# Patient Record
Sex: Male | Born: 1990 | Race: White | Hispanic: No | Marital: Single | Smoking: Former smoker
Health system: Southern US, Academic
[De-identification: ages and names within clinical notes are randomized; demographics above are authoritative.]

## PROBLEM LIST (undated history)

## (undated) DIAGNOSIS — D179 Benign lipomatous neoplasm, unspecified: Secondary | ICD-10-CM

## (undated) HISTORY — DX: Benign lipomatous neoplasm, unspecified: D17.9

## (undated) HISTORY — PX: SHOULDER SURGERY: SHX246

## (undated) HISTORY — PX: HX HERNIA REPAIR: SHX51

---
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
## 2019-04-08 ENCOUNTER — Ambulatory Visit (INDEPENDENT_AMBULATORY_CARE_PROVIDER_SITE_OTHER): Payer: 59 | Admitting: Surgery

## 2019-04-08 ENCOUNTER — Encounter (INDEPENDENT_AMBULATORY_CARE_PROVIDER_SITE_OTHER): Payer: Self-pay | Admitting: Surgery

## 2019-04-08 ENCOUNTER — Other Ambulatory Visit: Payer: Self-pay

## 2019-04-08 VITALS — BP 137/83 | HR 83 | Temp 96.9°F | Ht 66.0 in | Wt 193.0 lb

## 2019-04-08 DIAGNOSIS — M7989 Other specified soft tissue disorders: Secondary | ICD-10-CM

## 2019-04-08 NOTE — Progress Notes (Signed)
Name: Ronald Hart  Date of Birth: Oct 02, 1990  Date: 04/08/2019    Chief Complaint:  I have a mass on my right forearm    HPI: Ronald Hart is a pleasant 28 y.o. male who was referred to my office for evaluation and treatment of right forearm lipoma. he has been complaining of an enlarging soft tissue mass/possible lipoma on the right forearm that has been present for several years now.  The patient noted he 1st felt the mass about 4 years ago.  Since that time it has been increasing in size.  It can cause pain/discomfort with pushing on it.  No skin drainage or erythema        Past Medical History:   Diagnosis Date   . Lipoma            Past Surgical History:   Procedure Laterality Date   . HX HERNIA REPAIR     . SHOULDER SURGERY             Outpatient Encounter Medications as of 04/08/2019   Medication Sig Dispense Refill   . imiquimod (ALDARA) 5 % Cream in Packet apply topically to affected areas at bedtime 3 times a week for 16 weeks     . loratadine (CLARITIN) 10 mg Oral Tablet Allergy Relief (loratadine) 10 mg tablet     . montelukast (SINGULAIR) 10 mg Oral Tablet montelukast 10 mg tablet       No facility-administered encounter medications on file as of 04/08/2019.        No Known Allergies    Family Medical History:     Problem Relation (Age of Onset)    Cancer Paternal Aunt    Hypertension (High Blood Pressure) Mother              Social History     Tobacco Use   . Smoking status: Former Research scientist (life sciences)   . Smokeless tobacco: Never Used   Substance Use Topics   . Alcohol use: Never     Frequency: Never   . Drug use: Never       Review of Systems      Review of Systems 04/08/2019   Constitutional: negative   Eyes: negative   Ears, nose, mouth, and throat: negative   Respiratory: negative   Cardiovascular: negative   Gastrointestinal: negative   Genitourinary: negative   Integument/breast: negative   Hematologic/lymphatic: negative   Musculoskeletal: positive for       + joint aches;stiff joints   Neurological:  negative   Behavioral/Psych: negative   Endocrine: negative   Allergic/Immunologic: negative             PHYSICAL EXAM:    BP 137/83   Pulse 83   Temp 36.1 C (96.9 F)   Ht 1.676 m (5\' 6" )   Wt 87.5 kg (193 lb)   BMI 31.15 kg/m       -Awake, alert and Oriented x3. No acute distress  -Head is atraumatic, normocephalic.  -Neck is supple, has no palpable lymph nodes, no carotid bruit  -Chest with no deformities, clear to auscultation bilaterally.  -Heart has regular rate and rhythm, no rubs, murmurs or gallops.  -Abdomen is soft, non distended non tender to palpation. There are no hernias felt and no visceromegaly.  -Groins are soft with no nodules and no hernias identified.  -Extremities with no clubbing, no edema and no cyanosis.  Right forearm medial soft tissue mass, mobile, well-circumscribed, approximately 2 x 2 cm  -neurologically  the patient is A&O X 3, has MOE x 4,  And is able to ambulate without assist.        Assessment/PLAN:    ICD-10-CM    1. Soft tissue mass  M79.89      -patient appears to have a soft tissue mass that is about 2 x 2 cm on the medial aspect of the right forearm.  This has been increasing in size and causes pain with direct pressure  -plan for excision of soft tissue mass of right forearm under sedation.  Risks and benefits discussed with the patient and he gave informed consent

## 2019-04-08 NOTE — Nursing Note (Signed)
Patient is here for lipoma on arm

## 2019-04-17 DIAGNOSIS — D179 Benign lipomatous neoplasm, unspecified: Secondary | ICD-10-CM

## 2019-04-24 ENCOUNTER — Other Ambulatory Visit (INDEPENDENT_AMBULATORY_CARE_PROVIDER_SITE_OTHER): Payer: Self-pay

## 2020-10-29 ENCOUNTER — Encounter (HOSPITAL_COMMUNITY): Payer: Self-pay

## 2020-10-29 ENCOUNTER — Other Ambulatory Visit: Payer: Self-pay

## 2020-10-29 ENCOUNTER — Emergency Department (HOSPITAL_COMMUNITY): Payer: 59

## 2020-10-29 ENCOUNTER — Emergency Department
Admission: EM | Admit: 2020-10-29 | Discharge: 2020-10-29 | Disposition: A | Discharge status: Home / Self Care | Payer: 59 | Attending: Nurse Practitioner | Admitting: Nurse Practitioner

## 2020-10-29 DIAGNOSIS — Z87891 Personal history of nicotine dependence: Secondary | ICD-10-CM | POA: Insufficient documentation

## 2020-10-29 DIAGNOSIS — R059 Cough, unspecified: Secondary | ICD-10-CM

## 2020-10-29 DIAGNOSIS — R0781 Pleurodynia: Secondary | ICD-10-CM | POA: Insufficient documentation

## 2020-10-29 DIAGNOSIS — J45990 Exercise induced bronchospasm: Secondary | ICD-10-CM | POA: Insufficient documentation

## 2020-10-29 MED ORDER — NAPROXEN SODIUM 550 MG TABLET
550.00 mg | ORAL_TABLET | Freq: Two times a day (BID) | ORAL | 0 refills | Status: AC
Start: 2020-10-29 — End: 2020-11-08

## 2020-10-29 NOTE — ED Provider Notes (Signed)
[facility]           Name: Ronald Hart  Age and Gender: 30 y.o. male  PCP: No Pcp    Chief Complaint:  Patient presents with     Chief Complaint   Patient presents with   . Back Pain   . Rib Pain       HPI    HPI     Ronald Hart, date of birth 1991-04-21, is a 30 y.o. male who presents to the Emergency Department with a chief complaint of nonproductive cough, left rib pain for one week.  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 Denies fever.  Took Ibuprofen this morning for pain        ROS:   Review of Systems   Constitutional: Negative for chills and fever.   Respiratory: Positive for cough. Negative for shortness of breath.    Gastrointestinal: Negative.    Musculoskeletal: Negative for back pain.   Skin: Negative.    Neurological: Negative.          Past Medical History:  Diagnosis     Past Medical History:   Diagnosis Date   . Lipoma        Past Surgical History:  Past Surgical History:   Procedure Laterality Date   . Hx hernia repair     . Shoulder surgery         Family History:   Family History   Problem Relation Age of Onset   . Hypertension (High Blood Pressure) Mother    . Cancer Paternal Aunt        Social History     Social History     Tobacco Use   . Smoking status: Former Research scientist (life sciences)   . Smokeless tobacco: Never Used   Substance Use Topics   . Alcohol use: Never   . Drug use: Never       Social History     Substance and Sexual Activity   Drug Use Never       No Pcp    No Known Allergies      PE:   ED Triage Vitals [10/29/20 0735]   BP (Non-Invasive) (!) 160/101   Heart Rate 83   Respiratory Rate 18   Temperature 36.1 C (97 F)   SpO2 100 %   Weight 107 kg (235 lb)   Height 1.676 m (5\' 6" )     Physical Exam  HENT:      Mouth/Throat:      Mouth: Mucous membranes are moist.   Cardiovascular:      Rate and Rhythm: Normal rate.   Pulmonary:      Effort: Pulmonary effort is normal.      Breath sounds: Normal breath sounds. No wheezing.   Musculoskeletal:         General:  Tenderness present. Normal range of motion.      Comments: Mild tenderness to left anterior/lateral lower chest wall   Skin:     General: Skin is warm and dry.   Neurological:      Mental Status: He is alert and oriented to person, place, and time.   Psychiatric:         Mood and Affect: Mood normal.         DDx:  Differential diagnosis includes, but is not limited to bronchitis, pneumonia, costochondritis, fracture rib    Initial workup:      Orders:  Orders Placed This Encounter   . XR  CHEST PA AND LATERAL   . naproxen sodium (ANAPROX) 550 mg Oral Tablet           Diagnostics:    Labs:  No results found for this or any previous visit (from the past 12 hour(s)).  Labs reviewed and interpreted by me.    Radiology:    XR CHEST PA AND LATERAL   Final Result by Edi, Radresults In (06/18 0840)   Unremarkable two-view chest.  Clear lungs and unremarkable heart size.         Signed by Theresa Mulligan, MD        Negative per Dr. Olevia Bowens  EKG:  No results found for this visit on 10/29/20 (from the past 720 hour(s)).      ED Course/MDM:     During the patient's stay in the emergency department, the above listed imaging and/or labs were performed to assist with medical decision making and were reviewed by myself when available for review.    Patient rechecked and remained stable throughout remainder of emergency department course.    All questions/concerns addressed, and patient agrees with disposition plan.   Patient presents for one week history of nonproductive cough and left rib pain.  Denies fever. Denies back pain.  Has not called PCP.  Taking Ibuprofen for pain without relief.  Xray is negative per Dr. Olevia Bowens.  Patient will be discharged to home.            Medications given during ED stay include:       Clinical Impression:   Encounter Diagnoses   Name Primary?   . Rib pain on left side Yes   . Cough        Disposition: Discharged        // Terrance Mass, CRNP 10/29/2020, 07:43   [place], [facility]  Emergency Department

## 2020-10-29 NOTE — ED Triage Notes (Signed)
Pt states he was coughing last night and developed pain in ribs and back

## 2021-12-13 ENCOUNTER — Inpatient Hospital Stay (HOSPITAL_BASED_OUTPATIENT_CLINIC_OR_DEPARTMENT_OTHER)
Admission: RE | Admit: 2021-12-13 | Discharge: 2021-12-13 | Disposition: A | Discharge status: Home / Self Care | Payer: 59 | Source: Ambulatory Visit | Admitting: Radiology

## 2021-12-13 ENCOUNTER — Ambulatory Visit: Payer: 59 | Attending: ORTHOPAEDIC SURGERY | Admitting: ORTHOPAEDIC SURGERY

## 2021-12-13 ENCOUNTER — Encounter (HOSPITAL_COMMUNITY): Payer: Self-pay | Admitting: ORTHOPAEDIC SURGERY

## 2021-12-13 ENCOUNTER — Other Ambulatory Visit: Payer: Self-pay

## 2021-12-13 VITALS — Ht 67.0 in | Wt 230.0 lb

## 2021-12-13 DIAGNOSIS — Z9889 Other specified postprocedural states: Secondary | ICD-10-CM

## 2021-12-13 DIAGNOSIS — M7581 Other shoulder lesions, right shoulder: Secondary | ICD-10-CM

## 2021-12-13 DIAGNOSIS — M75101 Unspecified rotator cuff tear or rupture of right shoulder, not specified as traumatic: Secondary | ICD-10-CM | POA: Insufficient documentation

## 2021-12-13 DIAGNOSIS — M25511 Pain in right shoulder: Secondary | ICD-10-CM

## 2021-12-13 DIAGNOSIS — Z4789 Encounter for other orthopedic aftercare: Secondary | ICD-10-CM

## 2021-12-13 NOTE — Progress Notes (Signed)
[facility] Note       Name: Ronald Hart  MRN: [id number]    Patient ID: Ronald Hart is an 31 y.o. male.  Chief Complaint:     Chief Complaint   Patient presents with    Shoulder Pain     Rt , has had labrum repair , injections have helped in the past.        HPI/Subjective:    Patient is a very pleasant 31 year old who presents for re-evaluation.  Many years ago I performed a labral repair for him.  That has done well he is had no residual instability.  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  He is had increasing shoulder pain.  He wanted to make sure it was not do anything wrong to his shoulder.      He describes the pain as sharp in character.  The pain can be rated at a 6/10.  It is typically improved by rest, ice, activity modifications.  There is a mild amount of post activity pain.  Pain is improved by over-the-counter medications.  Both NSAIDs and Tylenol seem to help.  Pain is made worse by significant or forceful exercise.  Also long periods of use do exacerbate the pain.      Current Outpatient Medications   Medication Sig    busPIRone (BUSPAR) 10 mg Oral Tablet Take 1 Tablet (10 mg total) by mouth Twice daily    escitalopram oxalate (LEXAPRO) 20 mg Oral Tablet TAKE ONE AND A HALF TABLETS BY MOUTH DAILY FOR A TOTAL DAILY DOSE OF 30 MG    Ibuprofen (MOTRIN) 200 mg Oral Tablet Take 1 Tablet (200 mg total) by mouth Four times a day as needed for Pain    imiquimod (ALDARA) 5 % Cream in Packet apply topically to affected areas at bedtime 3 times a week for 16 weeks (Patient not taking: Reported on 12/13/2021)    loratadine (CLARITIN) 10 mg Oral Tablet Allergy Relief (loratadine) 10 mg tablet (Patient not taking: Reported on 12/13/2021)    mirtazapine (REMERON) 15 mg Oral Tablet Take 1 Tablet (15 mg total) by mouth Every night    montelukast (SINGULAIR) 10 mg Oral Tablet montelukast 10 mg tablet  (Patient not taking: Reported on 12/13/2021)     No Known Allergies   Social History     Substance and Sexual Activity   Alcohol Use Never     Social History     Tobacco Use   Smoking Status Former   Smokeless Tobacco Never     Social History     Substance and Sexual Activity   Drug Use Never       Review of Systems:  Constitutional: Negative for activity change, appetite change, diaphoresis and fatigue.   HENT: Negative for hearing loss, nosebleeds, postnasal drip, rhinorrhea, sinus pain and sore throat.    Eyes: Negative for photophobia, discharge and redness.   Respiratory: Negative for cough, choking, chest tightness, shortness of breath and wheezing.    Cardiovascular: Negative for chest pain, palpitations and leg swelling.   Gastrointestinal: Negative for abdominal distention, constipation, diarrhea, nausea and vomiting.   Genitourinary: Negative for difficulty urinating, dysuria, hematuria and urgency.   Neurological: Negative for dizziness, seizures, light-headedness and numbness.   Psychiatric/Behavioral: Negative for agitation, confusion, dysphoric mood and sleep disturbance.     Physical Exam  Vitals:  12/13/21 1523   Weight: 104 kg (230 lb)   Height: 1.702 m ('5\' 7"' )   BMI: 36.1      Constitutional:       General: He is not in acute distress.     Appearance: Normal appearance. He is not ill-appearing or diaphoretic.   Eyes:      Extraocular Movements: Extraocular movements intact.      Pupils: Pupils are equal, round, and reactive to light.   Skin:     Coloration: Skin is not jaundiced.      Findings: No Rash. No lesion.   Neurological:      Mental Status: He is alert.   Musculoskeletal:      Right Elbow Exam      Right elbow exam is normal.  Left Elbow Exam      Left elbow exam is normal.  Right Shoulder Exam   Tenderness      The patient is experiencing tenderness in the acromioclavicular joint.  Range of Motion      Forward Flexion: 165     Active abduction:  180     Passive abduction:  180      Extension: 40      External rotation: 30      Internal rotation 0 degrees: Lumbar   Muscle Strength      External rotation: 4/5      Supraspinatus: 4/5   Tests      Apprehension: neg     Hawkins test: positive     Cross arm: neg     Impingement: positive     Speed:  Negative, Yergsun's: positive  Other      Sensation: normal     Pulse: present  Right    Imaging:  X-rays reviewed.    Assessment:  Assessment/Plan   1. Right shoulder pain    2. HX rotator cuff repair right    3. Tear of right rotator cuff, unspecified tear extent, unspecified whether traumatic recurrent        Will proceed with an MR.  I would like to do an MR arthrogram.  I do not think this is a labral problem but he does seem to be having trouble with the shoulder with both impingement signs and biceps signs.  Crossed arm adduction is nontender and so I think the [facility] joint is okay.  He has been symptomatic and has had previous surgery I would like to review the labral repairs well.  We will get the arthrogram and then see him back to make further recommendations.  He is had trouble now for several months and so I do not think that he needs therapy given that he is already had surgery for this condition.    Plan:  Orders Placed This Encounter    XR SHOULDER RIGHT    CANCELED: MRI Right Shoulder without contrast    MRI ARTHROGRAM - SHOULDER RIGHT    BUN    CREATININE WITH EGFR    FLUORO ARTHROGRAM SHOULDER RIGHT         There are no exam notes on file for this visit.      Tarry Kos, MD,   12/13/2021      [address], [place]  [place] [id number]

## 2021-12-14 DIAGNOSIS — M25511 Pain in right shoulder: Secondary | ICD-10-CM

## 2022-01-11 ENCOUNTER — Inpatient Hospital Stay
Admission: RE | Admit: 2022-01-11 | Discharge: 2022-01-11 | Disposition: A | Discharge status: Home / Self Care | Payer: 59 | Source: Ambulatory Visit | Attending: ORTHOPAEDIC SURGERY | Admitting: ORTHOPAEDIC SURGERY

## 2022-01-11 ENCOUNTER — Ambulatory Visit (HOSPITAL_COMMUNITY)
Admission: RE | Admit: 2022-01-11 | Discharge: 2022-01-11 | Disposition: A | Discharge status: Home / Self Care | Payer: 59 | Source: Ambulatory Visit | Attending: ORTHOPAEDIC SURGERY | Admitting: ORTHOPAEDIC SURGERY

## 2022-01-11 ENCOUNTER — Other Ambulatory Visit: Payer: Self-pay

## 2022-01-11 DIAGNOSIS — M75101 Unspecified rotator cuff tear or rupture of right shoulder, not specified as traumatic: Secondary | ICD-10-CM

## 2022-01-11 DIAGNOSIS — Z9889 Other specified postprocedural states: Secondary | ICD-10-CM | POA: Insufficient documentation

## 2022-01-11 DIAGNOSIS — S43431A Superior glenoid labrum lesion of right shoulder, initial encounter: Secondary | ICD-10-CM | POA: Insufficient documentation

## 2022-01-11 DIAGNOSIS — M25511 Pain in right shoulder: Secondary | ICD-10-CM | POA: Insufficient documentation

## 2022-01-11 MED ORDER — GADOBENATE DIMEGLUMINE 529 MG/ML(0.1 MMOL/0.2 ML) INTRAVENOUS SOLUTION
0.3000 mL | INTRAVENOUS | Status: AC
Start: 2022-01-11 — End: 2022-01-11
  Administered 2022-01-11: 0.3 mL via INTRAVENOUS

## 2022-01-11 MED ORDER — IOHEXOL 300 MG IODINE/ML INTRAVENOUS SOLUTION
5.0000 mL | INTRAVENOUS | Status: AC
Start: 2022-01-11 — End: 2022-01-11
  Administered 2022-01-11: 5 mL via INTRA_ARTICULAR

## 2022-01-11 MED ORDER — ROPIVACAINE (PF) 5 MG/ML (0.5 %) INJECTION SOLUTION
INTRAMUSCULAR | Status: AC
Start: 2022-01-11 — End: 2022-01-11
  Filled 2022-01-11: qty 30

## 2022-01-11 MED ORDER — ROPIVACAINE (PF) 5 MG/ML (0.5 %) INJECTION SOLUTION
5.0000 mL | Freq: Once | INTRAMUSCULAR | Status: DC
Start: 2022-01-11 — End: 2022-01-12

## 2023-04-04 IMAGING — MR ARTHROGRAM MRI LT SHOULDER
6 series · 40 of 40 positions shown · IV contrast (multihance)
Comparison: none

﻿

Pertinent Hx:    Previous surgery.  Instability and pain.  Date of injury 03/02/2022.
TECHNIQUE: Using sterile technique and local anesthesia and with fluoroscopic guidance, a 20-gauge needle was placed into the shoulder.  Approximately 15 cc of a dilute mixture of MultiHance mixed in normal saline were injected into the shoulder along with a small amount of [id number].  Fluoro time: 0.4 min. 
T1 and T2-weighted coronal oblique, T1-weighted sagittal oblique, and T1 and T2-weighted axial images of the shoulder were taken.  Proton density sagittal images were taken with the shoulder abducted and externally rotated.

[Series 3: T1 · axial · 4.0mm · 0.52mm/px · z∈[-63,+38]mm · 8 of 22 slices shown]
[im 1/22]
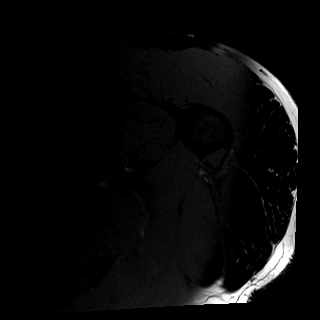
[im 4/22]
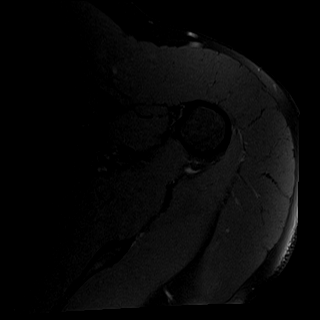
[im 7/22]
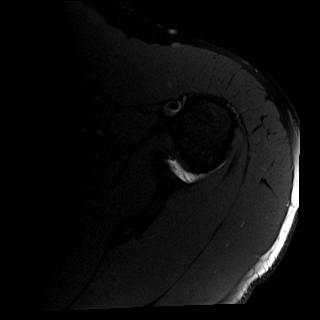
[im 10/22]
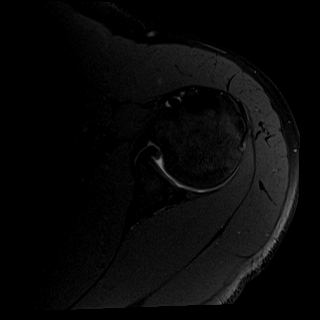
[im 13/22]
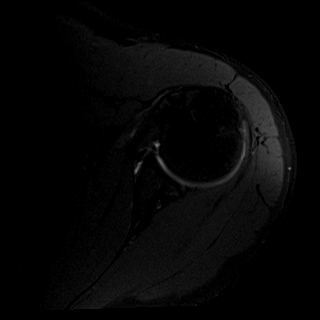
[im 16/22]
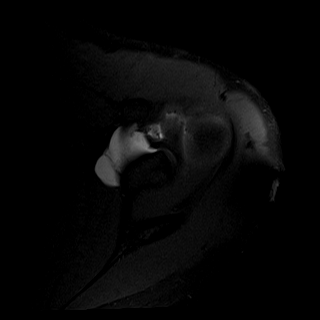
[im 19/22]
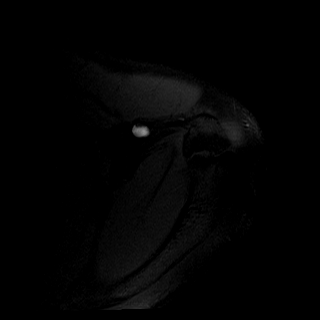
[im 22/22]
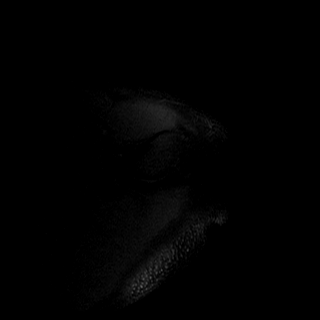

[Series 4: T1 fat-sat · oblique · 4.0mm · 0.50mm/px · 6 of 19 slices shown (1 of 2)]
[im 1/19]
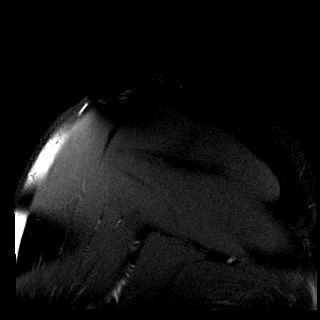
[im 4/19]
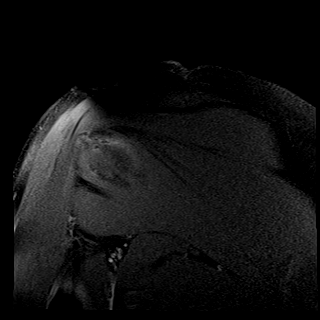
[im 8/19]
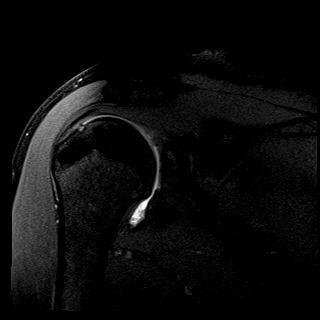
[im 11/19]
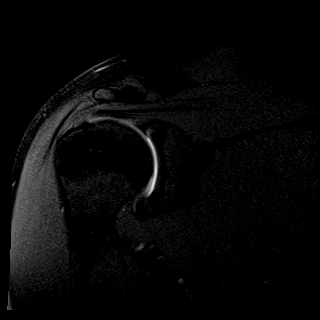
[im 15/19]
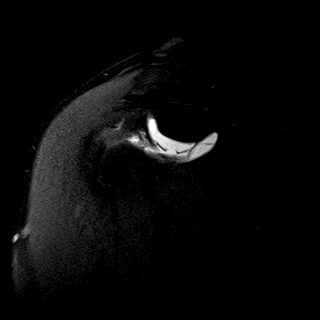
[im 19/19]
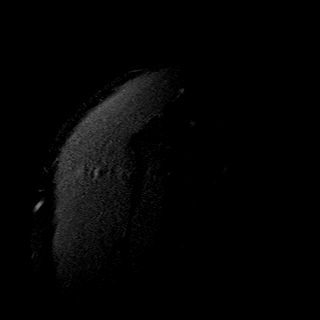

[Series 5: T2 fat-sat · oblique · 4.0mm · 0.50mm/px · 6 of 19 slices shown]
[im 1/19]
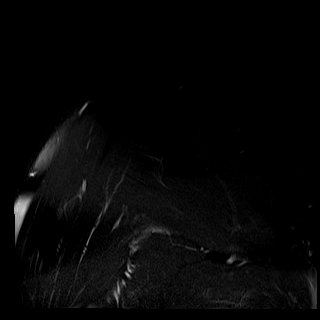
[im 4/19]
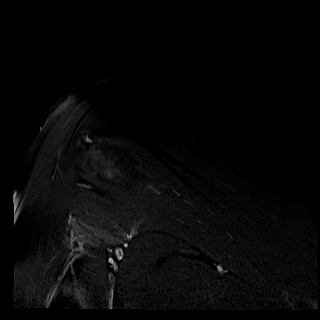
[im 8/19]
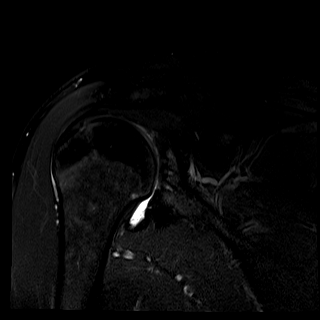
[im 11/19]
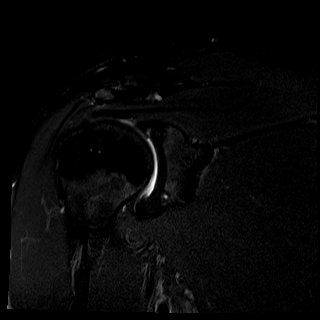
[im 15/19]
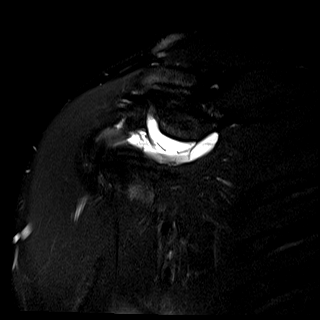
[im 19/19]
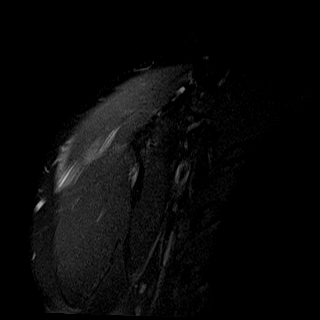

[Series 6: T1 fat-sat · oblique · 4.0mm · 0.50mm/px · 7 of 22 slices shown (2 of 2)]
[im 1/22]
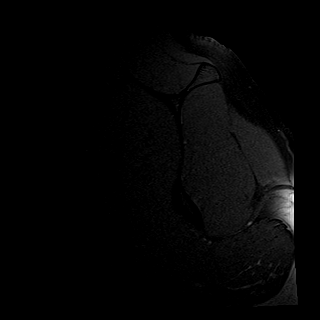
[im 4/22]
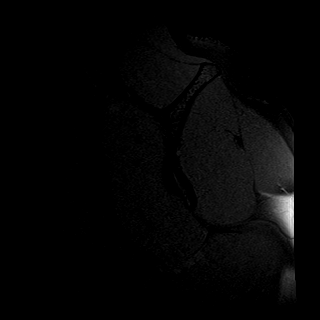
[im 8/22]
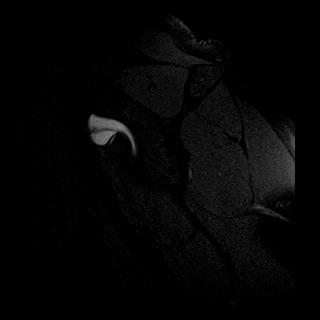
[im 11/22]
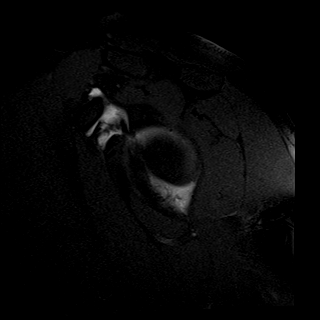
[im 15/22]
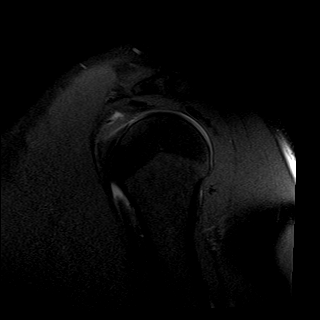
[im 18/22]
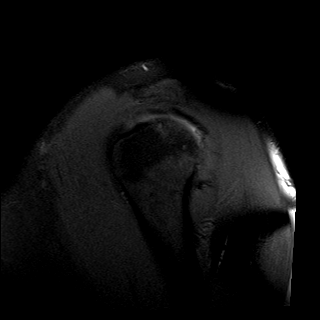
[im 22/22]
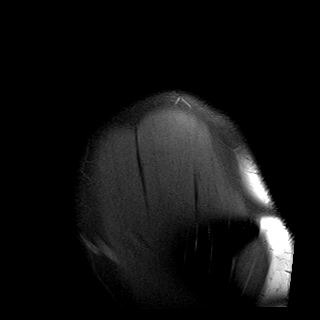

[Series 7: T2 · axial · 4.0mm · 0.50mm/px · z∈[-62,+39]mm · 7 of 22 slices shown]
[im 1/22]
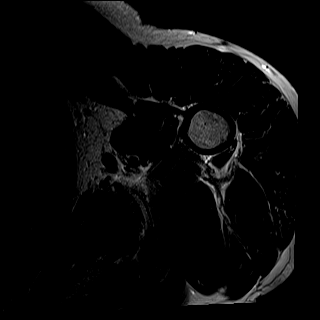
[im 4/22]
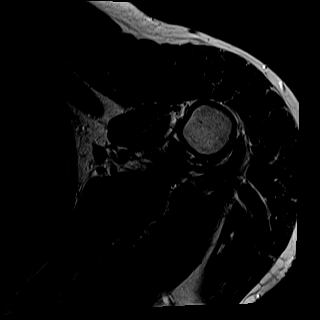
[im 8/22]
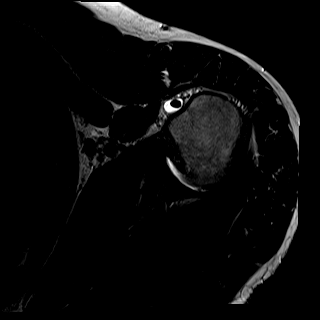
[im 11/22]
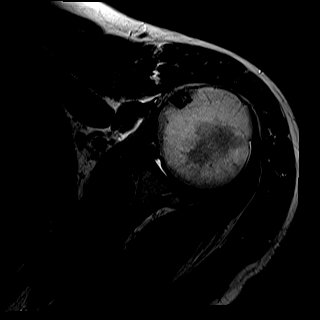
[im 15/22]
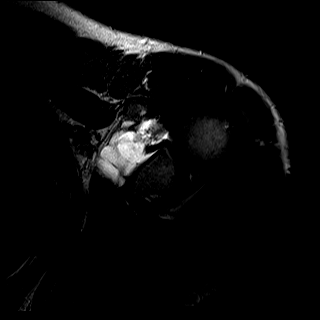
[im 18/22]
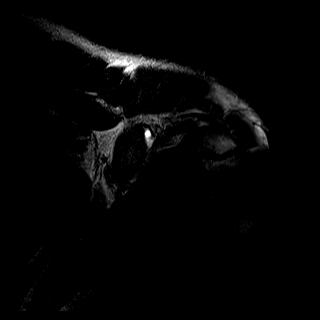
[im 22/22]
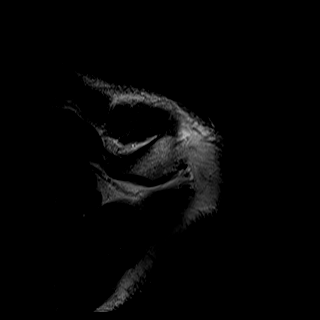

[Series 10: PD fat-sat · sagittal · 4.5mm · 0.53mm/px · 6 of 19 slices shown]
[im 1/19]
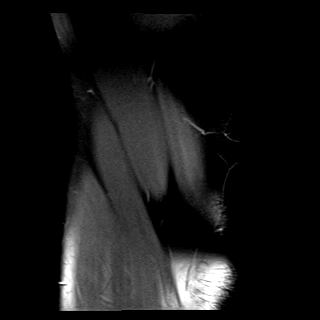
[im 4/19]
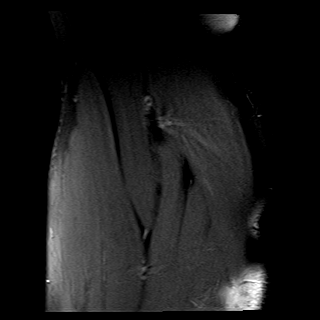
[im 8/19]
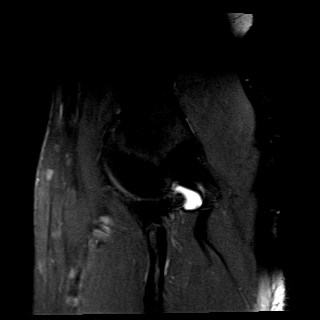
[im 11/19]
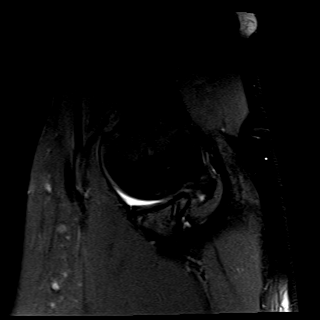
[im 15/19]
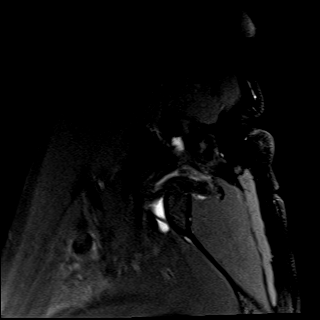
[im 19/19]
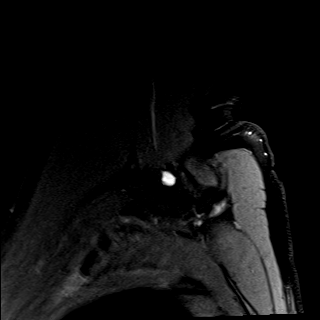

[40 of 40 positions shown; findings below may reference images not displayed]

FINDINGS: There are anchors within the glenoid from previous labral repair.  No labral tear currently can be identified.  

The biceps tendon is normal.  

Seen on the [place] view, there is a partial tear in the undersurface of the infraspinatus tendon.  This is not evident on the other sequences and the tear is likely superficial.  No other rotator cuff tendon tear can be identified.  

No chondral defect identified at the glenohumeral joint.  

There is a small degenerative-type cyst in the humeral head superiorly.  

There are acromioclavicular joint degenerative changes.  No other abnormality can be identified.  

Incidentally noted is an ununited ossification center at the end of the acromion, an os acromiale.
IMPRESSION: 1. Partial tear in the undersurface of the infraspinatus tendon.  

2. No labral tear identified.
